# Patient Record
Sex: Female | Born: 1965 | Race: White | Hispanic: No | Marital: Married | Smoking: Never smoker
Health system: Southern US, Community
[De-identification: ages and names within clinical notes are randomized; demographics above are authoritative.]

## PROBLEM LIST (undated history)

## (undated) DIAGNOSIS — K279 Peptic ulcer, site unspecified, unspecified as acute or chronic, without hemorrhage or perforation: Secondary | ICD-10-CM

## (undated) DIAGNOSIS — K625 Hemorrhage of anus and rectum: Secondary | ICD-10-CM

## (undated) DIAGNOSIS — K219 Gastro-esophageal reflux disease without esophagitis: Secondary | ICD-10-CM

## (undated) HISTORY — PX: CHOLECYSTECTOMY: SHX55

## (undated) HISTORY — DX: Peptic ulcer, site unspecified, unspecified as acute or chronic, without hemorrhage or perforation: K27.9

## (undated) HISTORY — PX: OOPHORECTOMY: SHX86

## (undated) HISTORY — PX: ABDOMINAL HYSTERECTOMY: SHX81

## (undated) HISTORY — DX: Hemorrhage of anus and rectum: K62.5

## (undated) HISTORY — DX: Gastro-esophageal reflux disease without esophagitis: K21.9

## (undated) HISTORY — PX: SHOULDER SURGERY: SHX246

## (undated) HISTORY — PX: OTHER SURGICAL HISTORY: SHX169

---

## 1999-07-19 ENCOUNTER — Other Ambulatory Visit: Admission: RE | Admit: 1999-07-19 | Discharge: 1999-07-19 | Payer: Self-pay | Admitting: *Deleted

## 1999-09-10 ENCOUNTER — Emergency Department (HOSPITAL_COMMUNITY): Admission: EM | Admit: 1999-09-10 | Discharge: 1999-09-10 | Payer: Self-pay

## 2000-08-13 ENCOUNTER — Encounter: Payer: Self-pay | Admitting: Internal Medicine

## 2000-08-13 ENCOUNTER — Encounter: Admission: RE | Admit: 2000-08-13 | Discharge: 2000-08-13 | Payer: Self-pay | Admitting: Internal Medicine

## 2002-09-24 HISTORY — PX: COLONOSCOPY: SHX174

## 2003-03-08 ENCOUNTER — Ambulatory Visit (HOSPITAL_COMMUNITY): Admission: RE | Admit: 2003-03-08 | Discharge: 2003-03-08 | Payer: Self-pay | Admitting: Gastroenterology

## 2003-03-25 ENCOUNTER — Other Ambulatory Visit: Admission: RE | Admit: 2003-03-25 | Discharge: 2003-03-25 | Payer: Self-pay | Admitting: Obstetrics and Gynecology

## 2006-10-10 ENCOUNTER — Emergency Department (HOSPITAL_COMMUNITY): Admission: EM | Admit: 2006-10-10 | Discharge: 2006-10-10 | Payer: Self-pay | Admitting: Emergency Medicine

## 2006-10-17 ENCOUNTER — Emergency Department (HOSPITAL_COMMUNITY): Admission: EM | Admit: 2006-10-17 | Discharge: 2006-10-17 | Payer: Self-pay | Admitting: Emergency Medicine

## 2007-04-22 ENCOUNTER — Other Ambulatory Visit: Admission: RE | Admit: 2007-04-22 | Discharge: 2007-04-22 | Payer: Self-pay | Admitting: *Deleted

## 2008-04-21 ENCOUNTER — Other Ambulatory Visit: Admission: RE | Admit: 2008-04-21 | Discharge: 2008-04-21 | Payer: Self-pay | Admitting: Gynecology

## 2009-07-26 ENCOUNTER — Ambulatory Visit (HOSPITAL_COMMUNITY): Admission: RE | Admit: 2009-07-26 | Discharge: 2009-07-27 | Payer: Self-pay | Admitting: Obstetrics and Gynecology

## 2009-07-26 ENCOUNTER — Encounter (INDEPENDENT_AMBULATORY_CARE_PROVIDER_SITE_OTHER): Payer: Self-pay | Admitting: Obstetrics and Gynecology

## 2010-01-20 ENCOUNTER — Ambulatory Visit (HOSPITAL_COMMUNITY): Admission: RE | Admit: 2010-01-20 | Discharge: 2010-01-20 | Payer: Self-pay | Admitting: Family Medicine

## 2010-12-27 LAB — CBC
HCT: 30.1 % — ABNORMAL LOW (ref 36.0–46.0)
HCT: 38.5 % (ref 36.0–46.0)
Hemoglobin: 10.3 g/dL — ABNORMAL LOW (ref 12.0–15.0)
Hemoglobin: 13.1 g/dL (ref 12.0–15.0)
MCHC: 34 g/dL (ref 30.0–36.0)
MCHC: 34.3 g/dL (ref 30.0–36.0)
MCV: 88.3 fL (ref 78.0–100.0)
MCV: 89 fL (ref 78.0–100.0)
Platelets: 227 10*3/uL (ref 150–400)
Platelets: 310 10*3/uL (ref 150–400)
RBC: 3.38 MIL/uL — ABNORMAL LOW (ref 3.87–5.11)
RBC: 4.36 MIL/uL (ref 3.87–5.11)
RDW: 13.4 % (ref 11.5–15.5)
RDW: 13.7 % (ref 11.5–15.5)
WBC: 13.4 10*3/uL — ABNORMAL HIGH (ref 4.0–10.5)
WBC: 9.8 10*3/uL (ref 4.0–10.5)

## 2010-12-27 LAB — HCG, SERUM, QUALITATIVE: Preg, Serum: NEGATIVE

## 2011-01-04 ENCOUNTER — Ambulatory Visit: Payer: Self-pay | Admitting: Gastroenterology

## 2011-02-09 NOTE — Op Note (Signed)
NAME:  Julia Frederick, Julia Frederick                         ACCOUNT NO.:  [id number]   MEDICAL RECORD NO.:  [id number]                   PATIENT TYPE:  AMB   LOCATION:  ENDO                                 FACILITY:  [facility]   PHYSICIAN:  Anselmo Rod, M.D.               DATE OF BIRTH:  [birth year]   DATE OF PROCEDURE:  03/08/2003  DATE OF DISCHARGE:                                 OPERATIVE REPORT   PROCEDURE PERFORMED:  Colonoscopy.   ENDOSCOPIST:  Charna Elizabeth, M.D.   INSTRUMENT USED:  Olympus video colonoscope.   INDICATIONS FOR PROCEDURE:  The patient is a 45 year old white female with  history of rectal bleeding.  Rule out colonic polyps, masses, etc.   PREPROCEDURE PREPARATION:  Informed consent was procured from the patient.  The patient was fasted for eight hours prior to the procedure and prepped  with a bottle of magnesium citrate and a gallon of GoLYTELY the night prior  to the procedure.   PREPROCEDURE PHYSICAL:  The patient had stable vital signs.  Neck supple.  Chest clear to auscultation.  S1 and S2 regular.  Abdomen soft with normal  bowel sounds.   DESCRIPTION OF PROCEDURE:  The patient was placed in left lateral decubitus  position and sedated with 60 mg of Demerol and 6 mg of Versed intravenously.  Once the patient was adequately sedated and maintained on low flow oxygen  and continuous cardiac monitoring, the Olympus video colonoscope was  advanced from the rectum to the cecum and terminal ileum.  There was a large  amount of residual stool in the colon.  Multiple washes were done, small  lesions could have been missed.  No masses, polyps, erosions, ulcerations or  diverticulosis was noted.  The appendicular orifice and ileocecal valve were  clearly visualized and photographed.  The terminal ileum appeared normal and  without lesions.  Retroflexion in the rectum revealed small internal  hemorrhoids but no other abnormalities were noted.  The patient tolerated  the  procedure well without complications. There seemed to be some extrinsic  compression of the rectum on examination of the rectum but no other  abnormalities were seen.   IMPRESSION:  1. Small nonbleeding internal hemorrhoids.  Otherwise normal colonoscopy of     the terminal ileum.  2. Large amount of residual stool in the colon.  Small lesions could have     been missed.  3. Extrinsic compression of the rectum.  Rule out uterine fibroids, etc.   RECOMMENDATIONS:  1. A high fiber diet with liberal fluid intake has been advocated.  2. Repeat colorectal cancer screening is recommended at age 54 unless the     patient develops any abnormal symptoms in the interim.  3. Pelvic ultrasound versus CT to evaluate extrinsic compression of the     rectum.  Anselmo Rod, M.D.    JNM/MEDQ  D:  03/08/2003  T:  03/08/2003  Job:  [id number]   cc:   Gabriel Earing, M.D.  [address]  [place]  [place] [address]  Fax: [phone]

## 2011-04-17 ENCOUNTER — Ambulatory Visit: Payer: Self-pay | Admitting: Gastroenterology

## 2011-09-03 ENCOUNTER — Encounter: Payer: Self-pay | Admitting: Gastroenterology

## 2011-09-05 ENCOUNTER — Ambulatory Visit (INDEPENDENT_AMBULATORY_CARE_PROVIDER_SITE_OTHER): Payer: Federal, State, Local not specified - PPO | Admitting: Gastroenterology

## 2011-09-05 ENCOUNTER — Encounter: Payer: Self-pay | Admitting: Gastroenterology

## 2011-09-05 DIAGNOSIS — R1013 Epigastric pain: Secondary | ICD-10-CM

## 2011-09-05 DIAGNOSIS — K3189 Other diseases of stomach and duodenum: Secondary | ICD-10-CM

## 2011-09-05 LAB — HEPATIC FUNCTION PANEL
AST: 18 U/L (ref 0–37)
Albumin: 4.3 g/dL (ref 3.5–5.2)
Alkaline Phosphatase: 80 U/L (ref 39–117)
Total Bilirubin: 0.3 mg/dL (ref 0.3–1.2)
Total Protein: 6.9 g/dL (ref 6.0–8.3)

## 2011-09-05 LAB — LIPASE: Lipase: 38 U/L (ref 0–75)

## 2011-09-05 MED ORDER — OMEPRAZOLE 20 MG PO CPDR: 20.0000 mg | DELAYED_RELEASE_CAPSULE | Freq: Every day | ORAL | Status: AC

## 2011-09-05 NOTE — Progress Notes (Signed)
Reminder in epic to follow up in 4 months °

## 2011-09-05 NOTE — Progress Notes (Signed)
  Subjective:    Patient ID: Julia Frederick, female    DOB: 07/12/1966, 45 y.o.   MRN: [id number]  PCP: MANN, PA-C  HPI Sx pain in upper abdomen associated with nausea 1x/week. If eats 30-45 mins later she's had to rush to the bathroom. Always had since GB came out. nO PROBLEMS SWALLOWING. No known triggers-can be in middle of day or in the night. Began in the middle of the night & though it was reflux. Never tried antacids. Better: Ultram. Lasts for for minutes. No vomiting, or change in bowel habits. No weight loss. Gained 10 lbs in past 5 years. No tobaccor or EtOH, No ASA or Godddy's but uses Ibuprofen, Motrin, Aleve: 1x/month. Had HSY. GB out: stones. No workup for her pain. No dysuri or hematuria, BRBPR, or melena. Appetite: fine. Heartburn al least once a day.   No past medical history on file.  Past Surgical History  Procedure Date  . Cesarean section   . Cholecystectomy   . S/p hysterectomy    No Known Allergies  Current Outpatient Prescriptions  Medication Sig Dispense Refill  . traMADol (ULTRAM) 50 MG tablet Take 50 mg by mouth every 6 (six) hours as needed. Maximum dose= 8 tablets per day       . omeprazole (PRILOSEC) 20 MG capsule Take 1 capsule (20 mg total) by mouth daily.  30 capsule  11    Family History  Problem Relation Age of Onset  . Hypertension    . Cancer      History   Social History  . Marital Status: Married    Spouse Name: N/A    Number of Children: N/A  . Years of Education: N/A   Occupational History  . Not on file.   Social History Main Topics  . Smoking status: Never Smoker   . Smokeless tobacco: Not on file  . Alcohol Use: No  . Drug Use: No  . Sexually Active: Not on file   Other Topics Concern  . Not on file   Social History Narrative  . No narrative on file         Review of Systems  All other systems reviewed and are negative.       Objective:   Physical Exam  Vitals reviewed. Constitutional: She is oriented to  person, place, and time. She appears well-developed and well-nourished. No distress.  HENT:  Head: Normocephalic and atraumatic.  Mouth/Throat: Oropharynx is clear and moist. No oropharyngeal exudate.  Eyes: Pupils are equal, round, and reactive to light. No scleral icterus.  Neck: Normal range of motion. Neck supple.  Cardiovascular: Normal rate, regular rhythm and normal heart sounds.   Pulmonary/Chest: Effort normal and breath sounds normal. No respiratory distress.  Abdominal: Soft. Bowel sounds are normal. She exhibits no distension. There is no tenderness.  Musculoskeletal: Normal range of motion. She exhibits no edema.  Lymphadenopathy:    She has no cervical adenopathy.  Neurological: She is alert and oriented to person, place, and time.       NO FOCAL DEFICITS   Psychiatric: She has a normal mood and affect.          Assessment & Plan:   

## 2011-09-05 NOTE — Patient Instructions (Signed)
TAKE PRILOSEC EVERY MORNING. SUBMIT BLOOD FOR LABS. GET ABDOMINAL ULTRASOUND. UPPER ENDOSCOPY ON 12/14 OR 12/18 FOLLOW UP IN 4 MOS.

## 2011-09-06 ENCOUNTER — Other Ambulatory Visit: Payer: Self-pay | Admitting: Gastroenterology

## 2011-09-06 ENCOUNTER — Encounter: Payer: Self-pay | Admitting: Gastroenterology

## 2011-09-06 DIAGNOSIS — R1013 Epigastric pain: Secondary | ICD-10-CM

## 2011-09-06 NOTE — Assessment & Plan Note (Signed)
New onset most likely 2o to gastritis v. GERD.  TAKE PRILOSEC EVERY MORNING. SUBMIT BLOOD FOR LABS. GET ABDOMINAL ULTRASOUND. UPPER ENDOSCOPY ON 12/14 OR 12/18 FOLLOW UP IN 4 MOS.

## 2011-09-06 NOTE — Progress Notes (Signed)
Cc to PCP 

## 2011-09-06 NOTE — Assessment & Plan Note (Signed)
2O TO GERD AD/OR PUD OR GASTRITIS, less likely pancreatic ca or cbd stones.  TAKE PRILOSEC EVERY MORNING. SUBMIT BLOOD FOR hfp/lipase. GET ABDOMINAL ULTRASOUND. UPPER ENDOSCOPY ON 12/14 OR 12/18 FOLLOW UP IN 4 MOS.

## 2011-09-06 NOTE — Progress Notes (Signed)
Quick Note:  Informed pt. She wants to know when her [place] will be scheduled. Routing to [employer]. ______

## 2011-09-07 ENCOUNTER — Encounter (HOSPITAL_COMMUNITY)
Admission: RE | Disposition: A | Discharge status: Home / Self Care | Payer: Self-pay | Source: Ambulatory Visit | Attending: Gastroenterology

## 2011-09-07 ENCOUNTER — Ambulatory Visit (HOSPITAL_COMMUNITY)
Admission: RE | Admit: 2011-09-07 | Discharge: 2011-09-07 | Disposition: A | Discharge status: Home / Self Care | Payer: Federal, State, Local not specified - PPO | Source: Ambulatory Visit | Attending: Gastroenterology | Admitting: Gastroenterology

## 2011-09-07 ENCOUNTER — Encounter (HOSPITAL_COMMUNITY): Payer: Self-pay | Admitting: *Deleted

## 2011-09-07 ENCOUNTER — Other Ambulatory Visit: Payer: Self-pay | Admitting: Gastroenterology

## 2011-09-07 DIAGNOSIS — R109 Unspecified abdominal pain: Secondary | ICD-10-CM

## 2011-09-07 DIAGNOSIS — K2970 Gastritis, unspecified, without bleeding: Secondary | ICD-10-CM

## 2011-09-07 DIAGNOSIS — K2990 Gastroduodenitis, unspecified, without bleeding: Secondary | ICD-10-CM

## 2011-09-07 DIAGNOSIS — K2940 Chronic atrophic gastritis without bleeding: Secondary | ICD-10-CM | POA: Insufficient documentation

## 2011-09-07 DIAGNOSIS — R1013 Epigastric pain: Secondary | ICD-10-CM

## 2011-09-07 DIAGNOSIS — A048 Other specified bacterial intestinal infections: Secondary | ICD-10-CM | POA: Insufficient documentation

## 2011-09-07 DIAGNOSIS — K2980 Duodenitis without bleeding: Secondary | ICD-10-CM | POA: Insufficient documentation

## 2011-09-07 HISTORY — PX: ESOPHAGOGASTRODUODENOSCOPY: SHX5428

## 2011-09-07 SURGERY — EGD (ESOPHAGOGASTRODUODENOSCOPY)
Anesthesia: Moderate Sedation

## 2011-09-07 MED ORDER — MIDAZOLAM HCL 5 MG/5ML IJ SOLN
INTRAMUSCULAR | Status: AC
  Filled 2011-09-07: qty 5

## 2011-09-07 MED ORDER — STERILE WATER FOR IRRIGATION IR SOLN
Status: DC | PRN
  Administered 2011-09-07: 14:00:00

## 2011-09-07 MED ORDER — MEPERIDINE HCL 100 MG/ML IJ SOLN
INTRAMUSCULAR | Status: DC | PRN
  Administered 2011-09-07: 25 mg via INTRAVENOUS
  Administered 2011-09-07: 50 mg via INTRAVENOUS
  Administered 2011-09-07: 25 mg via INTRAVENOUS

## 2011-09-07 MED ORDER — SODIUM CHLORIDE 0.45 % IV SOLN
Freq: Once | INTRAVENOUS | Status: AC
  Administered 2011-09-07: 1000 mL via INTRAVENOUS

## 2011-09-07 MED ORDER — BUTAMBEN-TETRACAINE-BENZOCAINE 2-2-14 % EX AERO
INHALATION_SPRAY | CUTANEOUS | Status: DC | PRN
  Administered 2011-09-07: 2 via TOPICAL

## 2011-09-07 MED ORDER — MEPERIDINE HCL 100 MG/ML IJ SOLN
INTRAMUSCULAR | Status: AC
  Filled 2011-09-07: qty 1

## 2011-09-07 MED ORDER — MIDAZOLAM HCL 5 MG/5ML IJ SOLN
INTRAMUSCULAR | Status: DC | PRN
  Administered 2011-09-07 (×2): 1 mg via INTRAVENOUS
  Administered 2011-09-07 (×2): 2 mg via INTRAVENOUS

## 2011-09-07 NOTE — Interval H&P Note (Signed)
History and Physical Interval Note:  09/07/2011 1:46 PM  Julia Frederick  has presented today for surgery, with the diagnosis of dyspepsia and epigastric pain  The various methods of treatment have been discussed with the patient and family. After consideration of risks, benefits and other options for treatment, the patient has consented to  Procedure(s): ESOPHAGOGASTRODUODENOSCOPY (EGD) as a surgical intervention .  The patients' history has been reviewed, patient examined, no change in status, stable for surgery.  I have reviewed the patients' chart and labs.  Questions were answered to the patient's satisfaction.     [employer]

## 2011-09-07 NOTE — H&P (View-Only) (Signed)
  Subjective:    Patient ID: Julia Frederick, female    DOB: 1965/10/16, 45 y.o.   MRN: [id number]  PCP: Loreta Ave, PA-C  HPI Sx pain in upper abdomen associated with nausea 1x/week. If eats 30-45 mins later she's had to rush to the bathroom. Always had since GB came out. nO PROBLEMS SWALLOWING. No known triggers-can be in middle of day or in the night. Began in the middle of the night & though it was reflux. Never tried antacids. Better: Ultram. Lasts for for minutes. No vomiting, or change in bowel habits. No weight loss. Gained 10 lbs in past 5 years. No tobaccor or EtOH, No ASA or Godddy's but uses Ibuprofen, Motrin, Aleve: 1x/month. Had HSY. GB out: stones. No workup for her pain. No dysuri or hematuria, BRBPR, or melena. Appetite: fine. Heartburn al least once a day.   No past medical history on file.  Past Surgical History  Procedure Date  . Cesarean section   . Cholecystectomy   . S/p hysterectomy    No Known Allergies  Current Outpatient Prescriptions  Medication Sig Dispense Refill  . traMADol (ULTRAM) 50 MG tablet Take 50 mg by mouth every 6 (six) hours as needed. Maximum dose= 8 tablets per day       . omeprazole (PRILOSEC) 20 MG capsule Take 1 capsule (20 mg total) by mouth daily.  30 capsule  11    Family History  Problem Relation Age of Onset  . Hypertension    . Cancer      History   Social History  . Marital Status: Married    Spouse Name: N/A    Number of Children: N/A  . Years of Education: N/A   Occupational History  . Not on file.   Social History Main Topics  . Smoking status: Never Smoker   . Smokeless tobacco: Not on file  . Alcohol Use: No  . Drug Use: No  . Sexually Active: Not on file   Other Topics Concern  . Not on file   Social History Narrative  . No narrative on file         Review of Systems  All other systems reviewed and are negative.       Objective:   Physical Exam  Vitals reviewed. Constitutional: She is oriented to  person, place, and time. She appears well-developed and well-nourished. No distress.  HENT:  Head: Normocephalic and atraumatic.  Mouth/Throat: Oropharynx is clear and moist. No oropharyngeal exudate.  Eyes: Pupils are equal, round, and reactive to light. No scleral icterus.  Neck: Normal range of motion. Neck supple.  Cardiovascular: Normal rate, regular rhythm and normal heart sounds.   Pulmonary/Chest: Effort normal and breath sounds normal. No respiratory distress.  Abdominal: Soft. Bowel sounds are normal. She exhibits no distension. There is no tenderness.  Musculoskeletal: Normal range of motion. She exhibits no edema.  Lymphadenopathy:    She has no cervical adenopathy.  Neurological: She is alert and oriented to person, place, and time.       NO FOCAL DEFICITS   Psychiatric: She has a normal mood and affect.          Assessment & Plan:

## 2011-09-07 NOTE — Op Note (Signed)
[facility] [address] [place], [place]  [address]  ENDOSCOPY PROCEDURE REPORT  PATIENT:  Julia Frederick, Julia Frederick  MR#:  [id number] BIRTHDATE:  1966/02/21, 45 yrs. old  GENDER:  female  ENDOSCOPIST:  Jonette Eva, MD Referred by:  PROCEDURE DATE:  09/07/2011 PROCEDURE:  EGD with biopsy, 43239 ASA CLASS: INDICATIONS:  ABDOMINAL PAIN USES MOTRIN OR ALEVE  MEDICATIONS:   Demerol 100 mg IV, Versed 6 mg IV TOPICAL ANESTHETIC:  Cetacaine Spray  DESCRIPTION OF PROCEDURE:     Physical exam was performed. Informed consent was obtained from the patient after explaining the benefits, risks, and alternatives to the procedure.  The patient was connected to the monitor and placed in the left lateral position.  Continuous oxygen was provided by nasal cannula and IV medicine administered through an indwelling cannula.  After administration of sedation, the patient's esophagus was intubated and the EG-[id number] ([id number]) endoscope was advanced under direct visualization to the second portion of the duodenum.  The scope was removed slowly by carefully examining the color, texture, anatomy, and integrity of the mucosa on the way out.  The patient was recovered in endoscopy and discharged home in satisfactory condition. <<PROCEDUREIMAGES>>  Moderate gastritis was found 7 BIOPSIED VIA COLD FORCEPS.  SEVERE Duodenitis(EROSIONS/ERYTHEMA) was found IN THE DUODENAL BULB. NL 2ND PORTION OF THE DUODENUM.  COMPLICATIONS:    None  ENDOSCOPIC IMPRESSION: 1) Moderate gastritis 2) Duodenitis, SEVERE  RECOMMENDATIONS: AVOID NSAIDS/ASA FOR 2 WEEKS OMP DAILY AWAIT BIOPSIES OPV APR 2013  REPEAT EXAM:  No  ______________________________ Jonette Eva, MD  CC:  n. eSIGNED:   Margaux Engen at 09/07/2011 02:20 PM  Glean Salvo, [phone]

## 2011-09-11 ENCOUNTER — Ambulatory Visit (HOSPITAL_COMMUNITY): Payer: Federal, State, Local not specified - PPO

## 2011-09-12 ENCOUNTER — Telehealth: Payer: Self-pay | Admitting: Gastroenterology

## 2011-09-12 NOTE — Telephone Encounter (Signed)
pLEASE CALL PT. Her stomach Bx showed H. Pylori infection. She needs AMOXICILLIN 500 mg 2 po BID for 10 days and Biaxin 500 mg po bid for 10 days, #qs, rfx0. She needs Omeprazole 20 mg BID for 10 days then 1 po dAILY. Med side effects include NVD, abd pain, and metallic taste.

## 2011-09-13 NOTE — Telephone Encounter (Signed)
LMOM to call.

## 2011-09-13 NOTE — Telephone Encounter (Signed)
Pt returned call and was informed. Rx's called to [facility] @ [address] on [address].

## 2011-09-24 ENCOUNTER — Encounter (HOSPITAL_COMMUNITY): Payer: Self-pay | Admitting: Gastroenterology

## 2011-12-25 ENCOUNTER — Encounter: Payer: Self-pay | Admitting: Gastroenterology

## 2012-06-24 ENCOUNTER — Other Ambulatory Visit (HOSPITAL_COMMUNITY): Payer: Self-pay | Admitting: Physician Assistant

## 2012-06-24 DIAGNOSIS — Z0000 Encounter for general adult medical examination without abnormal findings: Secondary | ICD-10-CM

## 2012-07-03 ENCOUNTER — Ambulatory Visit (HOSPITAL_COMMUNITY): Payer: Federal, State, Local not specified - PPO

## 2012-07-03 ENCOUNTER — Ambulatory Visit (HOSPITAL_COMMUNITY)
Admission: RE | Admit: 2012-07-03 | Discharge: 2012-07-03 | Disposition: A | Discharge status: Home / Self Care | Payer: Federal, State, Local not specified - PPO | Source: Ambulatory Visit | Attending: Physician Assistant | Admitting: Physician Assistant

## 2012-07-03 DIAGNOSIS — Z0000 Encounter for general adult medical examination without abnormal findings: Secondary | ICD-10-CM

## 2012-07-03 DIAGNOSIS — R922 Inconclusive mammogram: Secondary | ICD-10-CM | POA: Insufficient documentation

## 2012-07-09 ENCOUNTER — Other Ambulatory Visit: Payer: Self-pay | Admitting: Physician Assistant

## 2012-07-09 DIAGNOSIS — R928 Other abnormal and inconclusive findings on diagnostic imaging of breast: Secondary | ICD-10-CM

## 2012-07-10 ENCOUNTER — Other Ambulatory Visit: Payer: Self-pay | Admitting: Physician Assistant

## 2012-07-10 DIAGNOSIS — R928 Other abnormal and inconclusive findings on diagnostic imaging of breast: Secondary | ICD-10-CM

## 2012-07-15 ENCOUNTER — Ambulatory Visit
Admission: RE | Admit: 2012-07-15 | Discharge: 2012-07-15 | Disposition: A | Discharge status: Home / Self Care | Payer: Federal, State, Local not specified - PPO | Source: Ambulatory Visit | Attending: Physician Assistant | Admitting: Physician Assistant

## 2012-07-15 DIAGNOSIS — R928 Other abnormal and inconclusive findings on diagnostic imaging of breast: Secondary | ICD-10-CM

## 2013-06-02 ENCOUNTER — Emergency Department: Payer: Self-pay | Admitting: Emergency Medicine

## 2013-06-17 ENCOUNTER — Ambulatory Visit: Payer: Self-pay | Admitting: Specialist

## 2014-07-19 ENCOUNTER — Other Ambulatory Visit: Payer: Self-pay

## 2014-11-22 ENCOUNTER — Ambulatory Visit: Payer: Self-pay | Admitting: Family Medicine

## 2014-11-24 ENCOUNTER — Ambulatory Visit: Payer: Self-pay | Admitting: Family Medicine

## 2015-11-02 ENCOUNTER — Ambulatory Visit (INDEPENDENT_AMBULATORY_CARE_PROVIDER_SITE_OTHER): Payer: Federal, State, Local not specified - PPO

## 2015-11-02 ENCOUNTER — Encounter: Payer: Self-pay | Admitting: Podiatry

## 2015-11-02 ENCOUNTER — Ambulatory Visit (INDEPENDENT_AMBULATORY_CARE_PROVIDER_SITE_OTHER): Payer: Federal, State, Local not specified - PPO | Admitting: Podiatry

## 2015-11-02 VITALS — BP 133/91 | HR 113 | Resp 18

## 2015-11-02 DIAGNOSIS — R52 Pain, unspecified: Secondary | ICD-10-CM

## 2015-11-02 DIAGNOSIS — M722 Plantar fascial fibromatosis: Secondary | ICD-10-CM

## 2015-11-02 MED ORDER — METHYLPREDNISOLONE 4 MG PO TBPK: ORAL_TABLET | ORAL | Status: AC

## 2015-11-02 MED ORDER — MELOXICAM 15 MG PO TABS: 15.0000 mg | ORAL_TABLET | Freq: Every day | ORAL | Status: DC

## 2015-11-02 NOTE — Patient Instructions (Signed)

## 2015-11-02 NOTE — Progress Notes (Signed)
   Subjective:    Patient ID: Julia Frederick, female    DOB: December 02, 1965, 50 y.o.   MRN: [id number]  HPI i have some right foot pain and it travels to the arch area and has been going on since christmas and walks a lot and is sore and tender and hurts am and after i get up    Review of Systems  All other systems reviewed and are negative.      Objective:   Physical Exam: I have reviewed her past medical history medications allergies surgery social history and review of systems. Vital signs are stable alert and oriented 3. No acute distress. Pulses are strongly palpable bilateral. Neurologic sensorium is intact per Semmes-Weinstein monofilament. Deep tendon reflexes are intact bilateral and muscle strength +5 over 5 dorsiflexion plantar flexors and inverters everters all intrinsic musculature is intact. Orthopedic evaluation to Mr. Insall joints distal to the ankle for range of motion without crepitation. She has moderate to severe pain on palpation of the medial calcaneal tubercle of the right heel. No pain on medial and lateral compression of the calcaneus. Cutaneous evaluation demonstrates supple well-hydrated cutis no erythema edema cellulitis drainage or odor. Radiographs today do demonstrate a small plantar distally oriented calcaneal heel spur with a soft tissue increase in density at the plantar fascial calcaneal insertion site.        Assessment & Plan:  Assessment: Plantar fasciitis right foot.  Plan: We discussed the etiology pathology conservative versus surgical therapies. We discussed appropriate shoe gear stretching a size ice therapy and sugar modifications. I injected the right heel today with Kenalog and local anesthetic. Placed her in a plantar fascial brace as well as a night splint. Her prescription for a Medrol Dosepak to be followed by meloxicam and she was given a copy of written and diagrammatic stretching instructions. I will follow up with her in 1 month.

## 2015-11-30 ENCOUNTER — Ambulatory Visit: Payer: Federal, State, Local not specified - PPO | Admitting: Podiatry

## 2015-12-07 ENCOUNTER — Encounter (INDEPENDENT_AMBULATORY_CARE_PROVIDER_SITE_OTHER): Payer: Federal, State, Local not specified - PPO | Admitting: Podiatry

## 2015-12-07 NOTE — Progress Notes (Signed)
This encounter was created in error - please disregard.

## 2016-12-14 ENCOUNTER — Ambulatory Visit (INDEPENDENT_AMBULATORY_CARE_PROVIDER_SITE_OTHER): Payer: Federal, State, Local not specified - PPO

## 2016-12-14 ENCOUNTER — Encounter: Payer: Self-pay | Admitting: Podiatry

## 2016-12-14 ENCOUNTER — Ambulatory Visit (INDEPENDENT_AMBULATORY_CARE_PROVIDER_SITE_OTHER): Payer: Federal, State, Local not specified - PPO | Admitting: Podiatry

## 2016-12-14 DIAGNOSIS — R52 Pain, unspecified: Secondary | ICD-10-CM | POA: Diagnosis not present

## 2016-12-14 DIAGNOSIS — M722 Plantar fascial fibromatosis: Secondary | ICD-10-CM

## 2016-12-14 DIAGNOSIS — M205X1 Other deformities of toe(s) (acquired), right foot: Secondary | ICD-10-CM

## 2016-12-14 MED ORDER — MELOXICAM 15 MG PO TABS: 15.0000 mg | ORAL_TABLET | Freq: Every day | ORAL | 3 refills | Status: AC

## 2016-12-14 NOTE — Progress Notes (Signed)
This patient presents the office with chief complaint of severe pain noted in her right heel as the pain was very severe last night that she took this morning off from work. She says that she's had no history of trauma or reinjury to her foot. She says she had the same problem approximately one year ago with pain in the heel that travels through the arch of the right foot. She was seen by Dr. Al CorpusHyatt and treated with injection therapy and Mobic. She says she has been better until last night he presents the office today for an evaluation and treatment of this painful right foot  GENERAL APPEARANCE: Alert, conversant. Appropriately groomed. No acute distress.  VASCULAR: Pedal pulses are  palpable at  [facility] and PT bilateral.  Capillary refill time is immediate to all digits,  Normal temperature gradient.  Digital hair growth is present bilateral  NEUROLOGIC: sensation is normal to 5.07 monofilament at 5/5 sites bilateral.  Light touch is intact bilateral, Muscle strength normal.  MUSCULOSKELETAL: acceptable muscle strength, tone and stability bilateral.  Intrinsic muscluature intact bilateral.  Functional hallux limitus 1st MPJ  Right foot. Palpable pain at the inserion plantar fascia right foot.  DERMATOLOGIC: skin color, texture, and turgor are within normal limits.  No preulcerative lesions or ulcers  are seen, no interdigital maceration noted.  No open lesions present.  Digital nails are asymptomatic. No drainage noted.   Plantar fascitis with functional hallux limitus 1st MPJ  Right foot.  ROV  Xray reveal spurring right heel.  Recommended stretching and icing right foot.  Injection therapy .  Prescribed Mobic Powersteps prescribed.  RTC 10 day.   Helane GuntherGregory Melana Hingle DPM

## 2016-12-26 ENCOUNTER — Ambulatory Visit: Payer: Federal, State, Local not specified - PPO | Admitting: Podiatry

## 2017-12-24 ENCOUNTER — Other Ambulatory Visit: Payer: Self-pay | Admitting: Family Medicine

## 2017-12-24 DIAGNOSIS — Z1231 Encounter for screening mammogram for malignant neoplasm of breast: Secondary | ICD-10-CM

## 2017-12-30 ENCOUNTER — Ambulatory Visit
Admission: RE | Admit: 2017-12-30 | Discharge: 2017-12-30 | Disposition: A | Discharge status: Home / Self Care | Payer: Federal, State, Local not specified - PPO | Source: Ambulatory Visit | Attending: Family Medicine | Admitting: Family Medicine

## 2017-12-30 DIAGNOSIS — Z1231 Encounter for screening mammogram for malignant neoplasm of breast: Secondary | ICD-10-CM | POA: Insufficient documentation

## 2019-02-13 ENCOUNTER — Other Ambulatory Visit: Payer: Self-pay | Admitting: Family Medicine

## 2019-02-13 DIAGNOSIS — Z1231 Encounter for screening mammogram for malignant neoplasm of breast: Secondary | ICD-10-CM

## 2019-03-17 ENCOUNTER — Other Ambulatory Visit: Payer: Self-pay

## 2019-03-17 ENCOUNTER — Ambulatory Visit
Admission: RE | Admit: 2019-03-17 | Discharge: 2019-03-17 | Disposition: A | Discharge status: Home / Self Care | Payer: Federal, State, Local not specified - PPO | Source: Ambulatory Visit | Attending: Family Medicine | Admitting: Family Medicine

## 2019-03-17 DIAGNOSIS — Z1231 Encounter for screening mammogram for malignant neoplasm of breast: Secondary | ICD-10-CM

## 2020-08-11 ENCOUNTER — Other Ambulatory Visit: Payer: Self-pay | Admitting: Family Medicine

## 2020-08-11 DIAGNOSIS — Z1231 Encounter for screening mammogram for malignant neoplasm of breast: Secondary | ICD-10-CM

## 2020-08-17 ENCOUNTER — Other Ambulatory Visit: Payer: Self-pay

## 2020-08-17 ENCOUNTER — Ambulatory Visit
Admission: RE | Admit: 2020-08-17 | Discharge: 2020-08-17 | Disposition: A | Discharge status: Home / Self Care | Payer: Federal, State, Local not specified - PPO | Source: Ambulatory Visit | Attending: Family Medicine | Admitting: Family Medicine

## 2020-08-17 DIAGNOSIS — Z1231 Encounter for screening mammogram for malignant neoplasm of breast: Secondary | ICD-10-CM | POA: Insufficient documentation

## 2020-10-13 ENCOUNTER — Other Ambulatory Visit: Payer: Self-pay

## 2020-10-13 ENCOUNTER — Encounter: Payer: Self-pay | Admitting: Emergency Medicine

## 2020-10-13 ENCOUNTER — Emergency Department
Admission: EM | Admit: 2020-10-13 | Discharge: 2020-10-13 | Disposition: A | Discharge status: Home / Self Care | Payer: Federal, State, Local not specified - PPO | Attending: Emergency Medicine | Admitting: Emergency Medicine

## 2020-10-13 ENCOUNTER — Emergency Department: Payer: Federal, State, Local not specified - PPO

## 2020-10-13 DIAGNOSIS — N390 Urinary tract infection, site not specified: Secondary | ICD-10-CM | POA: Diagnosis not present

## 2020-10-13 DIAGNOSIS — R112 Nausea with vomiting, unspecified: Secondary | ICD-10-CM | POA: Diagnosis present

## 2020-10-13 LAB — COMPREHENSIVE METABOLIC PANEL
ALT: 16 U/L (ref 0–44)
AST: 15 U/L (ref 15–41)
Albumin: 4.4 g/dL (ref 3.5–5.0)
Alkaline Phosphatase: 74 U/L (ref 38–126)
Anion gap: 13 (ref 5–15)
BUN: 15 mg/dL (ref 6–20)
CO2: 24 mmol/L (ref 22–32)
Calcium: 9.4 mg/dL (ref 8.9–10.3)
Chloride: 100 mmol/L (ref 98–111)
Creatinine, Ser: 0.82 mg/dL (ref 0.44–1.00)
GFR, Estimated: >60 mL/min (ref >60)
Glucose, Bld: 151 mg/dL — ABNORMAL HIGH (ref 70–99)
Potassium: 3.5 mmol/L (ref 3.5–5.1)
Sodium: 137 mmol/L (ref 135–145)
Total Bilirubin: 0.9 mg/dL (ref 0.3–1.2)
Total Protein: 7.6 g/dL (ref 6.5–8.1)

## 2020-10-13 LAB — URINALYSIS, COMPLETE (UACMP) WITH MICROSCOPIC
Bacteria, UA: NONE SEEN
Bilirubin Urine: NEGATIVE
Glucose, UA: NEGATIVE mg/dL
Ketones, ur: 5 mg/dL — AB
Nitrite: NEGATIVE
Protein, ur: 30 mg/dL — AB
Specific Gravity, Urine: 1.035 — ABNORMAL HIGH (ref 1.005–1.030)
WBC, UA: >50 WBC/hpf — ABNORMAL HIGH (ref 0–5)
pH: 5 (ref 5.0–8.0)

## 2020-10-13 LAB — CBC
HCT: 43.2 % (ref 36.0–46.0)
Hemoglobin: 15.2 g/dL — ABNORMAL HIGH (ref 12.0–15.0)
MCH: 31 pg (ref 26.0–34.0)
MCHC: 35.2 g/dL (ref 30.0–36.0)
MCV: 88.2 fL (ref 80.0–100.0)
Platelets: 356 10*3/uL (ref 150–400)
RBC: 4.9 MIL/uL (ref 3.87–5.11)
RDW: 11.6 % (ref 11.5–15.5)
WBC: 11.8 10*3/uL — ABNORMAL HIGH (ref 4.0–10.5)
nRBC: 0 % (ref 0.0–0.2)

## 2020-10-13 LAB — LIPASE, BLOOD: Lipase: 29 U/L (ref 11–51)

## 2020-10-13 LAB — TROPONIN I (HIGH SENSITIVITY): Troponin I (High Sensitivity): 3 ng/L (ref <18)

## 2020-10-13 MED ORDER — SULFAMETHOXAZOLE-TRIMETHOPRIM 800-160 MG PO TABS: 1.0000 | ORAL_TABLET | Freq: Two times a day (BID) | ORAL | 0 refills | Status: DC

## 2020-10-13 MED ORDER — ONDANSETRON 4 MG PO TBDP
4.0000 mg | ORAL_TABLET | Freq: Once | ORAL | Status: AC
  Administered 2020-10-13: 4 mg via ORAL
  Filled 2020-10-13: qty 1

## 2020-10-13 MED ORDER — ONDANSETRON 4 MG PO TBDP: 4.0000 mg | ORAL_TABLET | Freq: Three times a day (TID) | ORAL | 0 refills | Status: AC | PRN

## 2020-10-13 NOTE — Discharge Instructions (Signed)
Follow-up with your primary care provider if any continued problems or concerns.  Increase fluids.  Take Zofran ODT every 8 hours if needed for nausea.  You may take Tylenol or ibuprofen if needed for pain, fever or body aches.  Begin taking antibiotic as directed until completely finished.  If any severe worsening of your symptoms such as persistent vomiting, fever, chills or back ache you will need to return to the emergency department.

## 2020-10-13 NOTE — ED Triage Notes (Signed)
Pt to ED from home c/o abd pain and cramping for a couple days and then nausea and vomiting x4 today.  States chills at home, fever, weakness as well.  Pt A&Ox4, chest rise even and unlabored, in NAD at this time.

## 2020-10-13 NOTE — ED Notes (Signed)
Pt c/o N/V with chills, fatigue , with abd cramping that started yesterday. Denies diarrhea. Denies any painful urination. States the nausea has improved since her arrival to the ED.Marland Kitchen

## 2020-10-13 NOTE — ED Provider Notes (Signed)
[facility] Emergency Department Provider Note  ____________________________________________   Event Date/Time   First MD Initiated Contact with Patient 10/13/20 [phone]     (approximate)  I have reviewed the triage vital signs and the nursing notes.   HISTORY  Chief Complaint Abdominal Pain, Nausea, and Vomiting   HPI Julia Frederick is a 55 y.o. female presents to the ED with complaint of nausea and vomiting x4 and chills.  Patient states that she had some lower abdominal cramping yesterday but denies any diarrhea.  Patient denies any urinary symptoms at this time.  Patient reports that she has had urinary tract infection in the past.  No respiratory complaints.  She reports her pain as 6 out of 10.       Past Medical History:  Diagnosis Date  . GERD (gastroesophageal reflux disease)   . PUD (peptic ulcer disease) 2001 UGI  . Rectal bleeding 2004 2o to hemorrhoids    Patient Active Problem List   Diagnosis Date Noted  . Dyspepsia 09/06/2011  . Epigastric pain 09/06/2011    Past Surgical History:  Procedure Laterality Date  . ABDOMINAL HYSTERECTOMY    . CESAREAN SECTION    . CHOLECYSTECTOMY    . COLONOSCOPY  2004  . ESOPHAGOGASTRODUODENOSCOPY  09/07/2011   Procedure: ESOPHAGOGASTRODUODENOSCOPY (EGD);  Surgeon: Arlyce Harman, MD;  Location: [facility];  Service: Endoscopy;  Laterality: N/A;  1:55  . OOPHORECTOMY    . S/P Hysterectomy      Prior to Admission medications   Medication Sig Start Date End Date Taking? Authorizing Provider  ondansetron (ZOFRAN ODT) 4 MG disintegrating tablet Take 1 tablet (4 mg total) by mouth every 8 (eight) hours as needed for nausea or vomiting. 10/13/20  Yes Bridget Hartshorn L, PA-C  sulfamethoxazole-trimethoprim (BACTRIM DS) 800-160 MG tablet Take 1 tablet by mouth 2 (two) times daily. 10/13/20  Yes Bridget Hartshorn L, PA-C  fluticasone (FLONASE) 50 MCG/ACT nasal spray Place into the nose. 02/15/15 02/15/16   [provider]  meloxicam (MOBIC) 15 MG tablet Take 1 tablet (15 mg total) by mouth daily. 12/14/16   Helane Gunther, DPM  methylPREDNISolone (MEDROL) 4 MG TBPK tablet Tapering 6 day dose pack 11/02/15   Hyatt, Max T, DPM  omeprazole (PRILOSEC) 20 MG capsule Take 1 capsule (20 mg total) by mouth daily. 09/05/11 09/04/12  West Bali, MD  traMADol (ULTRAM) 50 MG tablet Take 50 mg by mouth every 6 (six) hours as needed. Maximum dose= 8 tablets per day     [provider]    Allergies Patient has no known allergies.  Family History  Problem Relation Age of Onset  . Hypertension Other   . Cancer Other   . Breast cancer Paternal Grandmother     Social History Social History   Tobacco Use  . Smoking status: Never Smoker  . Smokeless tobacco: Never Used  Substance Use Topics  . Alcohol use: No  . Drug use: No    Review of Systems Constitutional: No fever/chills Eyes: No visual changes. ENT: No sore throat. Cardiovascular: Denies chest pain. Respiratory: Denies shortness of breath. Gastrointestinal: Positive for lower abdominal cramping.  Positive nausea, positive vomiting.  No diarrhea.  No constipation. Genitourinary: Negative for dysuria. Musculoskeletal: Negative for back pain. Skin: Negative for rash. Neurological: Negative for headaches, focal weakness or numbness.  ____________________________________________   PHYSICAL EXAM:  VITAL SIGNS: ED Triage Vitals  Enc Vitals Group     BP 10/13/20 0142 Marland Kitchen)  141/101     Pulse Rate 10/13/20 0142 94     Resp 10/13/20 0142 16     Temp 10/13/20 0142 99 F (37.2 C)     Temp Source 10/13/20 0142 Oral     SpO2 10/13/20 0142 96 %     Weight 10/13/20 0143 180 lb (81.6 kg)     Height 10/13/20 0143 5\' 3"  (1.6 m)     Head Circumference --      Peak Flow --      Pain Score 10/13/20 0143 6     Pain Loc --      Pain Edu? --      Excl. in GC? --     Constitutional: Alert and oriented. Well appearing and in no  acute distress. Eyes: Conjunctivae are normal. PERRL. EOMI. Head: Atraumatic. Cardiovascular: Normal rate, regular rhythm. Grossly normal heart sounds.  Good peripheral circulation. Respiratory: Normal respiratory effort.  No retractions. Lungs CTAB. Gastrointestinal: Soft and nontender. No distention.  No CVA tenderness.  Bowel sounds normoactive x4 quadrants. Musculoskeletal: Moves upper and lower extremities without any difficulty and patient is ambulatory without any assistance. Neurologic:  Normal speech and language. No gross focal neurologic deficits are appreciated. No gait instability. Skin:  Skin is warm, dry and intact. No rash noted. Psychiatric: Mood and affect are normal. Speech and behavior are normal.  ____________________________________________   LABS (all labs ordered are listed, but only abnormal results are displayed)  Labs Reviewed  COMPREHENSIVE METABOLIC PANEL - Abnormal; Notable for the following components:      Result Value   Glucose, Bld 151 (*)    All other components within normal limits  CBC - Abnormal; Notable for the following components:   WBC 11.8 (*)    Hemoglobin 15.2 (*)    All other components within normal limits  URINALYSIS, COMPLETE (UACMP) WITH MICROSCOPIC - Abnormal; Notable for the following components:   Color, Urine YELLOW (*)    APPearance CLOUDY (*)    Specific Gravity, Urine 1.035 (*)    Hgb urine dipstick SMALL (*)    Ketones, ur 5 (*)    Protein, ur 30 (*)    Leukocytes,Ua MODERATE (*)    WBC, UA >50 (*)    All other components within normal limits  URINE CULTURE  LIPASE, BLOOD  TROPONIN I (HIGH SENSITIVITY)    RADIOLOGY I, 10/15/20, personally viewed and evaluated these images (plain radiographs) as part of my medical decision making, as well as reviewing the written report by the radiologist.   Official radiology report(s): DG Chest 2 View  Result Date: 10/13/2020 CLINICAL DATA:  Cough, nausea and vomiting  with fever and weakness EXAM: CHEST - 2 VIEW COMPARISON:  None. FINDINGS: No consolidation, features of edema, pneumothorax, or effusion. Pulmonary vascularity is normally distributed. The cardiomediastinal contours are unremarkable. No acute osseous or soft tissue abnormality. Degenerative changes are present in the imaged spine and shoulders. Cholecystectomy clips in the right upper quadrant. IMPRESSION: No acute cardiopulmonary abnormality. Electronically Signed   By: 10/15/2020 M.D.   On: 10/13/2020 02:09   EKG Normal sinus rhythm with ventricular rate of 86. EKG reviewed by doctors in major ED. ____________________________________________   PROCEDURES  Procedure(s) performed (including Critical Care):  Procedures   ____________________________________________   INITIAL IMPRESSION / ASSESSMENT AND PLAN / ED COURSE  As part of my medical decision making, I reviewed the following data within the electronic MEDICAL RECORD NUMBER Notes from prior ED visits and  [place] Controlled Substance Database  55 year old female presents to the ED with complaint of nausea, vomiting, chills and fatigue.  Patient states she began having some low abdominal cramping for couple of days.  Patient denies any upper respiratory symptoms or diarrhea.  Urinalysis was very suggestive of a urinary tract infection.  Chest x-ray was negative.  All other lab work was within normal limits and troponin was 3.  Patient was discharged with prescription for Bactrim DS twice daily for 10 days and Zofran 4 mg ODT as needed for nausea.  Patient is encouraged to drink fluids.  She is also told to return to the emergency department if over the weekend she develops any worsening of her symptoms, continued vomiting or especially high fever, chills and back pain. ____________________________________________   FINAL CLINICAL IMPRESSION(S) / ED DIAGNOSES  Final diagnoses:  Acute urinary tract infection     ED Discharge Orders          Ordered    sulfamethoxazole-trimethoprim (BACTRIM DS) 800-160 MG tablet  2 times daily        10/13/20 0856    ondansetron (ZOFRAN ODT) 4 MG disintegrating tablet  Every 8 hours PRN        10/13/20 0856          *Please note:  Charolotte Eke was evaluated in Emergency Department on 10/13/2020 for the symptoms described in the history of present illness. She was evaluated in the context of the global COVID-19 pandemic, which necessitated consideration that the patient might be at risk for infection with the SARS-CoV-2 virus that causes COVID-19. Institutional protocols and algorithms that pertain to the evaluation of patients at risk for COVID-19 are in a state of rapid change based on information released by regulatory bodies including the CDC and federal and state organizations. These policies and algorithms were followed during the patient's care in the ED.  Some ED evaluations and interventions may be delayed as a result of limited staffing during and the pandemic.*   Note:  This document was prepared using Dragon voice recognition software and may include unintentional dictation errors.    Tommi Rumps, PA-C 10/13/20 1222    Delton Pra[place], MD 10/13/20 [phone]

## 2020-10-14 LAB — URINE CULTURE
Culture: 10000 — AB
Special Requests: NORMAL

## 2021-07-27 ENCOUNTER — Encounter: Payer: Self-pay | Admitting: Emergency Medicine

## 2021-07-27 ENCOUNTER — Ambulatory Visit
Admission: EM | Admit: 2021-07-27 | Discharge: 2021-07-27 | Disposition: A | Discharge status: Home / Self Care | Payer: Federal, State, Local not specified - PPO | Attending: Emergency Medicine | Admitting: Emergency Medicine

## 2021-07-27 DIAGNOSIS — N390 Urinary tract infection, site not specified: Secondary | ICD-10-CM | POA: Diagnosis not present

## 2021-07-27 LAB — POCT URINALYSIS DIP (MANUAL ENTRY)
Glucose, UA: NEGATIVE mg/dL
Nitrite, UA: NEGATIVE
Protein Ur, POC: 30 mg/dL — AB
Spec Grav, UA: >=1.03 — AB (ref 1.010–1.025)
Urobilinogen, UA: 1 E.U./dL
pH, UA: 6 (ref 5.0–8.0)

## 2021-07-27 MED ORDER — CEPHALEXIN 500 MG PO CAPS: 500.0000 mg | ORAL_CAPSULE | Freq: Two times a day (BID) | ORAL | 0 refills | Status: AC

## 2021-07-27 NOTE — ED Provider Notes (Signed)
Subjective:    Julia Frederick is a very pleasant 55 y.o. female who presents with concerns for UTI due to nausea and decreased urination over the last 2 days. No unilateral back pain, vomiting, fever, vaginal discharge, concern for STD.  Past medical history, past surgical history, current medications reviewed.  Allergies: has No Known Allergies.  Review of Systems See HPI   Objective:     Vitals:   07/27/21 1906  BP: 124/86  Pulse: 96  Temp: 99.5 F (37.5 C)  SpO2: 96%     General: Appears well-developed and well-nourished. No acute distress.  Cardiovascular: Normal rate Pulm/Chest: No respiratory distress Abdominal: No CVAT.  Neurological: Alert and oriented to person, place, and time.  Skin: Skin is warm and dry.  Psychiatric: Normal mood, affect, behavior, and thought content.  GU:  Deferred secondary to self-collect specimen  Laboratory:  Orders Placed This Encounter  Procedures   Urine Culture   POCT urinalysis dipstick   Results for orders placed or performed during the hospital encounter of 07/27/21  POCT urinalysis dipstick  Result Value Ref Range   Color, UA yellow yellow   Clarity, UA clear clear   Glucose, UA negative negative mg/dL   Bilirubin, UA small (A) negative   Ketones, POC UA trace (5) (A) negative mg/dL   Spec Grav, UA >=3.419 (A) 1.010 - 1.025   Blood, UA trace-lysed (A) negative   pH, UA 6.0 5.0 - 8.0   Protein Ur, POC =30 (A) negative mg/dL   Urobilinogen, UA 1.0 0.2 or 1.0 E.U./dL   Nitrite, UA Negative Negative   Leukocytes, UA Small (1+) (A) Negative     Assessment:   1. Acute UTI - Urine Culture; Standing - Urine Culture - cephALEXin (KEFLEX) 500 MG capsule; Take 1 capsule (500 mg total) by mouth 2 (two) times daily for 7 days.  Dispense: 14 capsule; Refill: 0   Plan:   MDM: Patient presents with concerns for UTI due to nausea and decreased urination over the last 2 days. No unilateral back pain, vomiting, fever, vaginal  discharge, concern for STD.  Urinalysis reveals small bilirubin, trace ketones, increased specific gravity, trace blood, 30 protein and small leukocytes.  Patient reports having had a UTI in January and states that this is a similar presentation for her.  On chart review, previous urine culture in January showed strep positive urine culture.  Urine culture today is pending.  Given symptoms and urinalysis, likely acute UTI.  Will cover with Keflex today.  Advised about home treatment and care to include increasing fluid intake, Uristat/Azo as needed for discomfort.  Advised to return to clinic or go to the ER with any fever, one-sided back pain or vomiting.  Patient verbalized understanding and agreed with plan.  Patient stable upon discharge.  Return as needed.    Discharge Instructions      You were seen today for an infection in the lower urinary tract. Your urine sample today was sent for a culture. The urine culture will show what type of bacteria grows and if you are on the appropriate antibiotic. If the antibiotic needs to be changed, you will receive a phone call from the follow up nurse who will give you more information. If you do not receive a call, then you are on the correct antibiotic.  Take antibiotics as directed. Finish course even if feeling better sooner. Drink plenty of clear fluids. Over the counter "Uristat" or "Azo Standard" may help your discomfort.  This will turn your urine dark orange or red and may stain contacts (remove before using). You may experience 24 - 48 hours of continuing discomfort until medication controls the infection.  Return to clinic or go to the ER if you develop a fever, one-sided back pain, or vomiting as these are signs of a worsening infection.          Amalia Greenhouse, FNP 07/27/21 1936

## 2021-07-27 NOTE — ED Triage Notes (Signed)
Pt c/o nausea and not urinating often x 2 days

## 2021-07-27 NOTE — Discharge Instructions (Addendum)

## 2021-07-29 LAB — URINE CULTURE: Culture: <10000 — AB

## 2021-12-22 ENCOUNTER — Other Ambulatory Visit: Payer: Self-pay | Admitting: Family Medicine

## 2021-12-22 DIAGNOSIS — Z1231 Encounter for screening mammogram for malignant neoplasm of breast: Secondary | ICD-10-CM

## 2022-01-31 ENCOUNTER — Ambulatory Visit
Admission: RE | Admit: 2022-01-31 | Discharge: 2022-01-31 | Disposition: A | Discharge status: Home / Self Care | Payer: Federal, State, Local not specified - PPO | Source: Ambulatory Visit | Attending: Family Medicine | Admitting: Family Medicine

## 2022-01-31 DIAGNOSIS — Z1231 Encounter for screening mammogram for malignant neoplasm of breast: Secondary | ICD-10-CM | POA: Diagnosis present

## 2022-07-18 ENCOUNTER — Other Ambulatory Visit: Payer: Self-pay

## 2022-07-18 ENCOUNTER — Telehealth: Payer: Self-pay | Admitting: Emergency Medicine

## 2022-07-18 ENCOUNTER — Emergency Department
Admission: EM | Admit: 2022-07-18 | Discharge: 2022-07-18 | Disposition: A | Discharge status: Home / Self Care | Attending: Emergency Medicine | Admitting: Emergency Medicine

## 2022-07-18 DIAGNOSIS — W540XXA Bitten by dog, initial encounter: Secondary | ICD-10-CM | POA: Diagnosis not present

## 2022-07-18 DIAGNOSIS — S80811A Abrasion, right lower leg, initial encounter: Secondary | ICD-10-CM | POA: Insufficient documentation

## 2022-07-18 DIAGNOSIS — Z23 Encounter for immunization: Secondary | ICD-10-CM | POA: Insufficient documentation

## 2022-07-18 DIAGNOSIS — Y990 Civilian activity done for income or pay: Secondary | ICD-10-CM | POA: Diagnosis not present

## 2022-07-18 DIAGNOSIS — S8991XA Unspecified injury of right lower leg, initial encounter: Secondary | ICD-10-CM | POA: Diagnosis present

## 2022-07-18 MED ORDER — AMOXICILLIN-POT CLAVULANATE 875-125 MG PO TABS: 1.0000 | ORAL_TABLET | Freq: Two times a day (BID) | ORAL | 0 refills | Status: AC

## 2022-07-18 MED ORDER — LIDOCAINE-EPINEPHRINE-TETRACAINE (LET) SOLUTION
3.0000 mL | Freq: Once | NASAL | Status: AC
  Administered 2022-07-18: 3 mL via TOPICAL
  Filled 2022-07-18: qty 3

## 2022-07-18 MED ORDER — TETANUS-DIPHTH-ACELL PERTUSSIS 5-2.5-18.5 LF-MCG/0.5 IM SUSY
0.5000 mL | PREFILLED_SYRINGE | Freq: Once | INTRAMUSCULAR | Status: AC
  Administered 2022-07-18: 0.5 mL via INTRAMUSCULAR
  Filled 2022-07-18: qty 0.5

## 2022-07-18 MED ORDER — AMOXICILLIN-POT CLAVULANATE 875-125 MG PO TABS
1.0000 | ORAL_TABLET | Freq: Once | ORAL | Status: AC
  Administered 2022-07-18: 1 via ORAL
  Filled 2022-07-18: qty 1

## 2022-07-18 MED ORDER — LIDOCAINE HCL (PF) 1 % IJ SOLN
5.0000 mL | Freq: Once | INTRAMUSCULAR | Status: DC
  Filled 2022-07-18: qty 5

## 2022-07-18 NOTE — Telephone Encounter (Signed)
Augmentin rx °

## 2022-07-18 NOTE — ED Provider Notes (Signed)
[facility] Emergency Department Provider Note     Event Date/Time   First MD Initiated Contact with Patient 07/18/22 1315     (approximate)   History   Animal Bite   HPI  Julia Frederick is a 56 y.o. female presents to the ED for evaluation of a work-related injury.  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  She presents to the ED with a posterior right calf wound with bleeding controlled.  No other injury reported at this time.  Patient reports an out-of-date tetanus status.  She reports a healthy, well attended [place] dog that bit her on the leg.  Animal control was notified by her employer.     Physical Exam   Triage Vital Signs: ED Triage Vitals [07/18/22 1223]  Enc Vitals Group     BP (!) 147/103     Pulse Rate 94     Resp 16     Temp 98.1 F (36.7 C)     Temp Source Oral     SpO2 97 %     Weight 170 lb (77.1 kg)     Height 5\' 2"  (1.575 m)     Head Circumference      Peak Flow      Pain Score 0     Pain Loc      Pain Edu?      Excl. in GC?     Most recent vital signs: Vitals:   07/18/22 1223  BP: (!) 147/103  Pulse: 94  Resp: 16  Temp: 98.1 F (36.7 C)  SpO2: 97%    General Awake, no distress. NAD CV:  Good peripheral perfusion.  RESP:  Normal effort.  ABD:  No distention.  MSK:  Posterior right calf with a small 2 cm deep skin abrasion with no active bleeding at this time.  Patient without any induration or warmth of the calf.   ED Results / Procedures / Treatments   Labs (all labs ordered are listed, but only abnormal results are displayed) Labs Reviewed - No data to display   EKG    RADIOLOGY   No results found.   PROCEDURES:  Critical Care performed: No  Procedures   MEDICATIONS ORDERED IN ED: Medications  Tdap (BOOSTRIX) injection 0.5 mL (0.5 mLs Intramuscular Given 07/18/22 1328)  amoxicillin-clavulanate (AUGMENTIN) 875-125 MG per tablet 1 tablet (1 tablet  Oral Given 07/18/22 1327)  lidocaine (PF) (XYLOCAINE) 1 % injection 5 mL (5 mLs Infiltration Given 07/18/22 1327)     IMPRESSION / MDM / ASSESSMENT AND PLAN / ED COURSE  I reviewed the triage vital signs and the nursing notes.                              Differential diagnosis includes, but is not limited to, dog bite, open wound, contusion, abrasion  Patient's presentation is most consistent with acute, uncomplicated illness.  Patient to the ED for evaluation of a work-related dog bite injury.  She presents with a calf wound denies any other injury at the time.  Tetanus is updated the patient started on an appropriate course of Augmentin.  She is discharged with wound care instructions and supplies.  Patient's diagnosis is consistent with dog bite. Patient is to follow up with animal control for rabies status confirmation and or requirement for quarantine as needed or otherwise directed. Patient is given ED precautions to return  to the ED for any worsening or new symptoms.     FINAL CLINICAL IMPRESSION(S) / ED DIAGNOSES   Final diagnoses:  Dog bite, initial encounter     Rx / DC Orders   ED Discharge Orders     None        Note:  This document was prepared using Dragon voice recognition software and may include unintentional dictation errors.    Melvenia Needles, PA-C 07/18/22 1507    Arta Silence, MD 07/24/22 [phone]

## 2022-07-18 NOTE — Discharge Instructions (Addendum)
Keep the wound clean, dry, and covered. Take the antibiotic as directed. Follow-up with your primary provider for interim wound care. Follow-up with the Stage manager for disposition of rabies vaccine status versus quarantine status of the dog.

## 2022-07-18 NOTE — ED Triage Notes (Signed)
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 Bleeding is controlled at this time.

## 2022-09-01 IMAGING — MG MM DIGITAL SCREENING BILAT W/ TOMO AND CAD
8 series · 8 of 24 positions shown · non-contrast
Comparison: Previous exam(s).

CLINICAL DATA: Screening.

EXAM:
DIGITAL SCREENING BILATERAL MAMMOGRAM WITH TOMOSYNTHESIS AND CAD
TECHNIQUE: Bilateral screening digital craniocaudal and mediolateral oblique
mammograms were obtained. Bilateral screening digital breast
tomosynthesis was performed. The images were evaluated with
computer-aided detection.

[R MLO synth-2D]
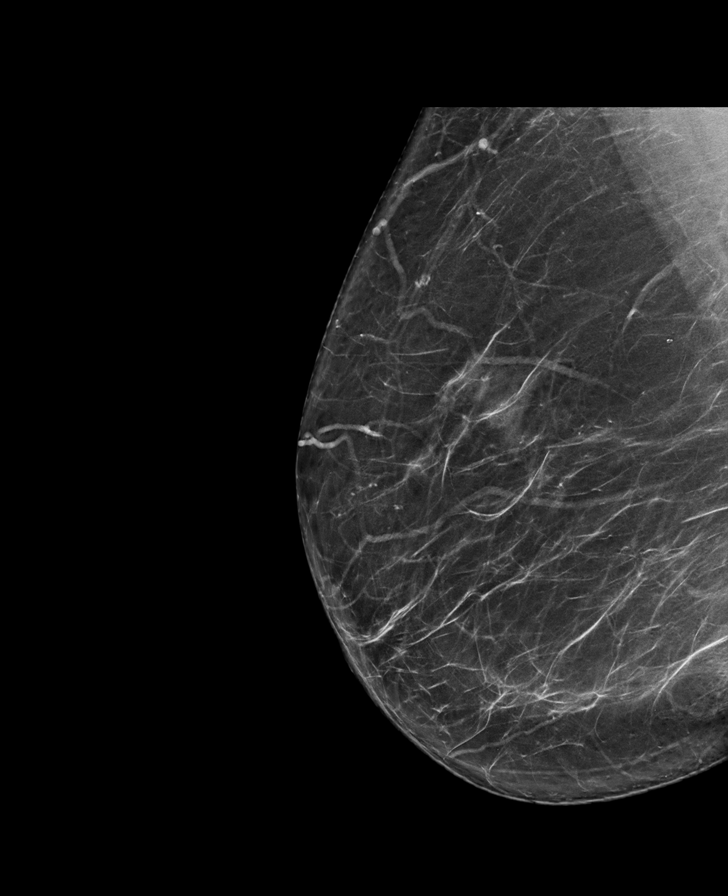

[L CC synth-2D]
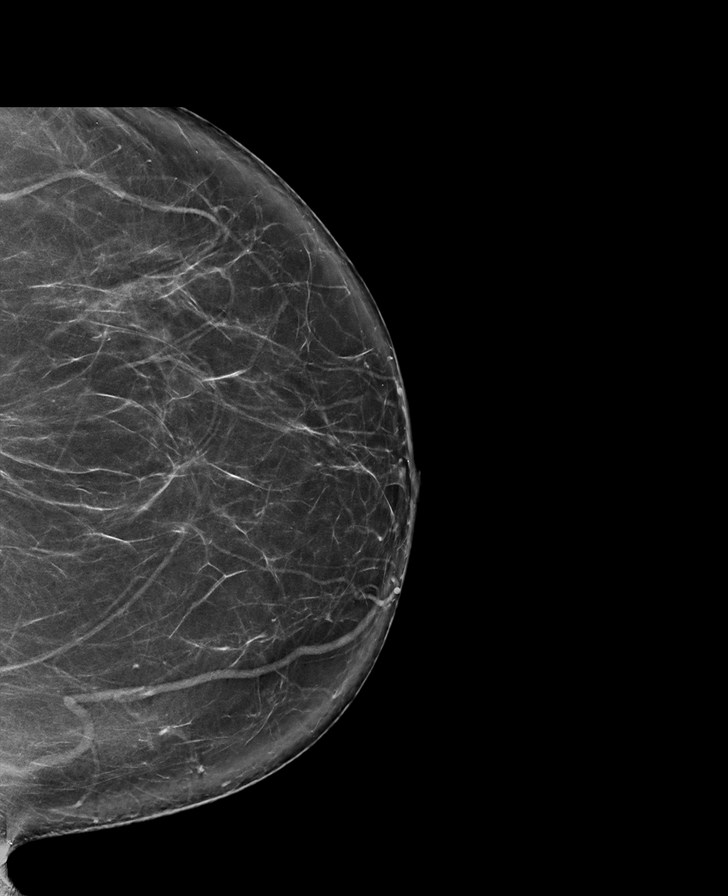

[R CC synth-2D]
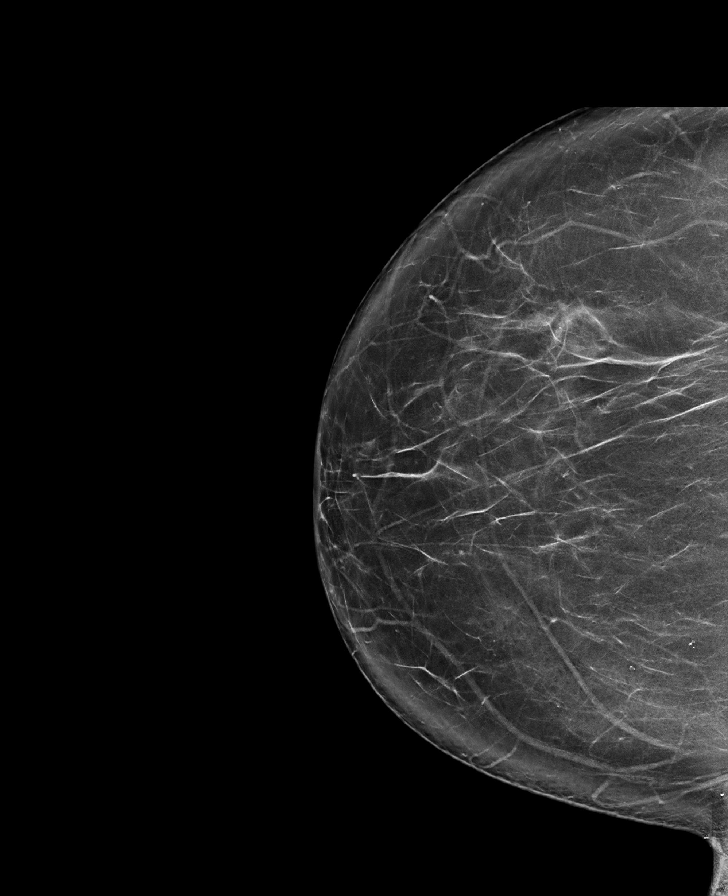

[L MLO synth-2D]
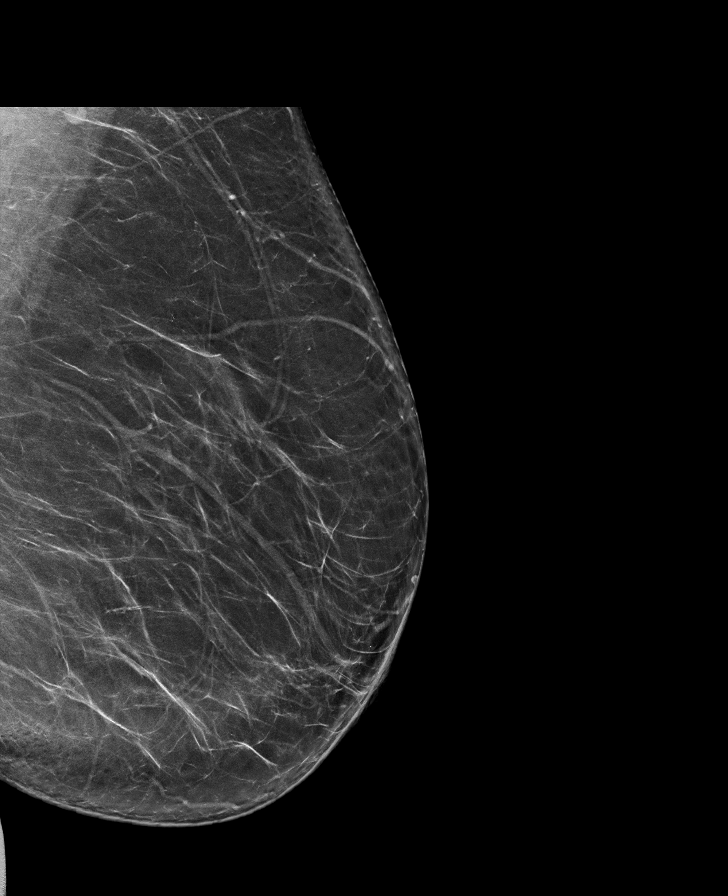

[R MLO tomo · tomo slice 43/84.0]
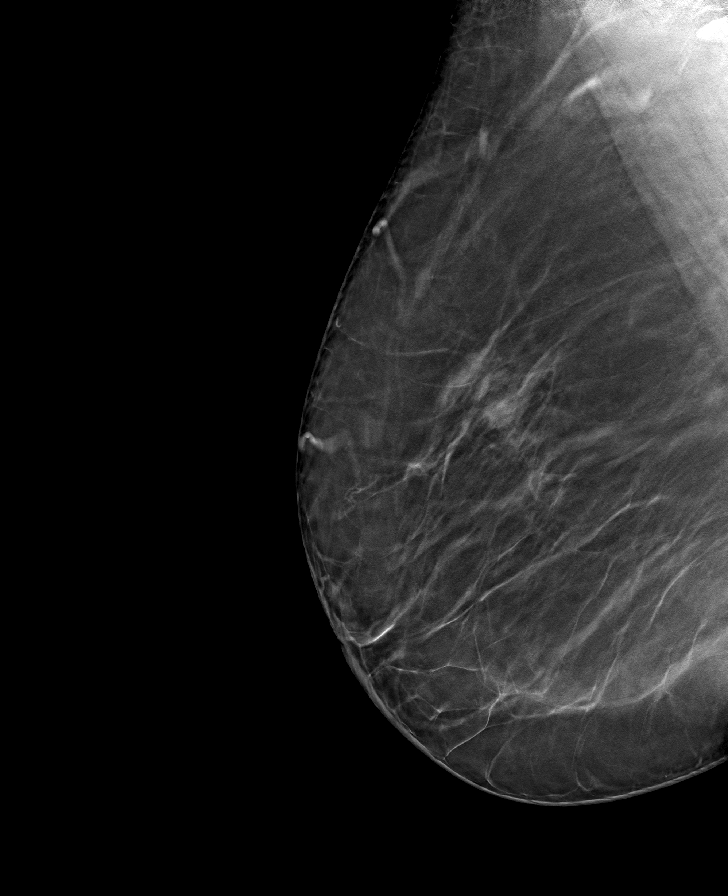

[L MLO tomo · tomo slice 43/84.0]
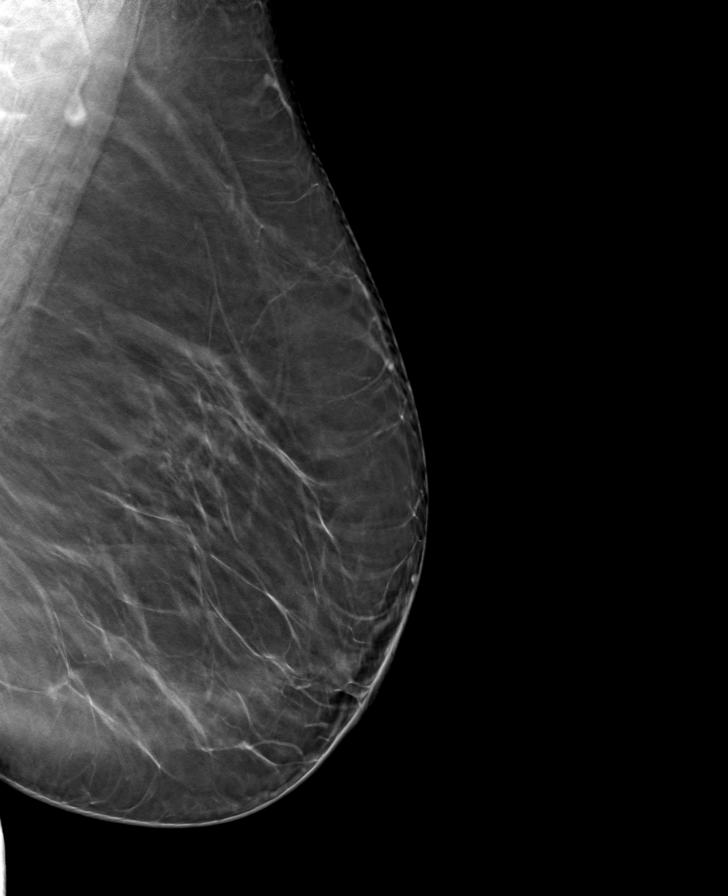

[L CC tomo · tomo slice 41/82.0]
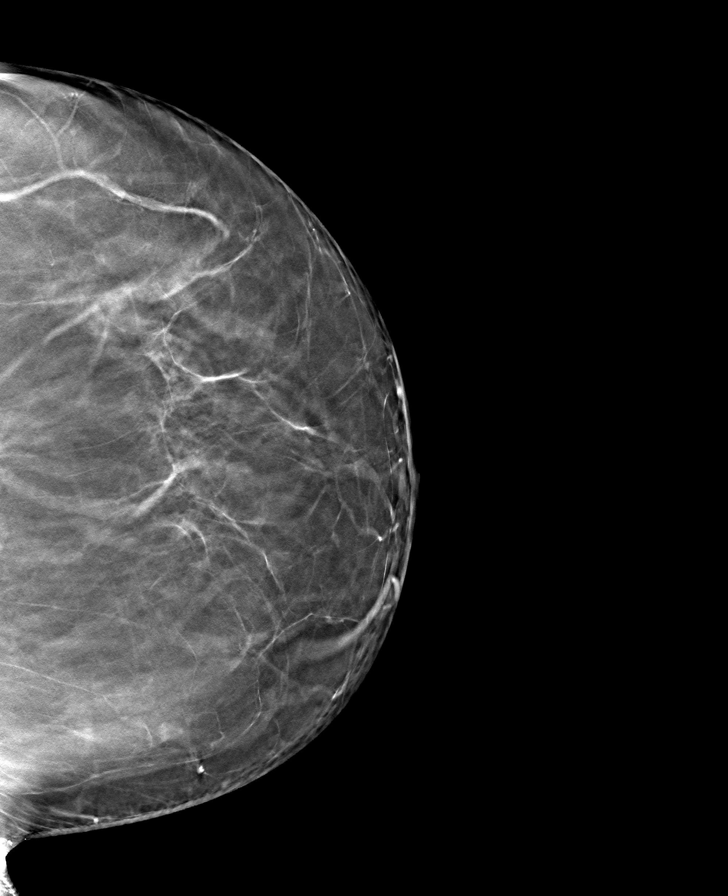

[R CC tomo · tomo slice 41/82.0]
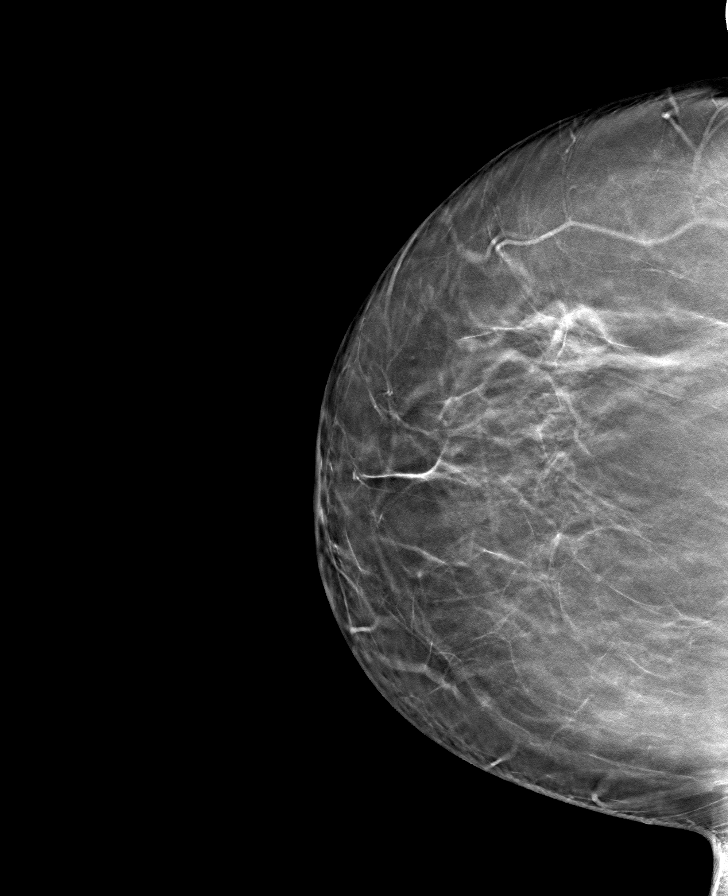

[8 of 24 positions shown; findings below may reference images not displayed]

ACR Breast Density Category b: There are scattered areas of
fibroglandular density.
FINDINGS: There are no findings suspicious for malignancy.
IMPRESSION: No mammographic evidence of malignancy. A result letter of this
screening mammogram will be mailed directly to the patient.

RECOMMENDATION:
Screening mammogram in one year. (Code:51-O-LD2)

BI-RADS CATEGORY  1: Negative.

## 2023-07-03 ENCOUNTER — Other Ambulatory Visit: Payer: Self-pay | Admitting: Family Medicine

## 2023-07-03 DIAGNOSIS — Z1231 Encounter for screening mammogram for malignant neoplasm of breast: Secondary | ICD-10-CM

## 2023-07-16 ENCOUNTER — Ambulatory Visit
Admission: RE | Admit: 2023-07-16 | Discharge: 2023-07-16 | Disposition: A | Discharge status: Home / Self Care | Payer: Federal, State, Local not specified - PPO | Source: Ambulatory Visit | Attending: Family Medicine | Admitting: Family Medicine

## 2023-07-16 DIAGNOSIS — Z1231 Encounter for screening mammogram for malignant neoplasm of breast: Secondary | ICD-10-CM | POA: Insufficient documentation

## 2023-11-06 ENCOUNTER — Ambulatory Visit: Payer: Federal, State, Local not specified - PPO

## 2024-03-10 ENCOUNTER — Ambulatory Visit
Admission: RE | Admit: 2024-03-10 | Discharge: 2024-03-10 | Disposition: A | Discharge status: Home / Self Care | Source: Ambulatory Visit | Attending: Emergency Medicine | Admitting: Emergency Medicine

## 2024-03-10 ENCOUNTER — Other Ambulatory Visit: Payer: Self-pay

## 2024-03-10 VITALS — BP 116/80 | HR 94 | Temp 98.6°F | Resp 18

## 2024-03-10 DIAGNOSIS — R21 Rash and other nonspecific skin eruption: Secondary | ICD-10-CM | POA: Diagnosis not present

## 2024-03-10 MED ORDER — PREDNISONE 10 MG (21) PO TBPK: ORAL_TABLET | Freq: Every day | ORAL | 0 refills | Status: AC

## 2024-03-10 MED ORDER — DEXAMETHASONE SODIUM PHOSPHATE 10 MG/ML IJ SOLN
10.0000 mg | Freq: Once | INTRAMUSCULAR | Status: AC
  Administered 2024-03-10: 10 mg via INTRAMUSCULAR

## 2024-03-10 NOTE — Discharge Instructions (Signed)
 Today you are being treated for inflammatory rash most likely caused by something your skin is coming contact with, at this time no signs of infection  You have been given an injection of steroids today in the office today to help reduce the inflammatory process that occurs with this rash which will help minimize your itching as well as begin to clear  Starting tomorrow take prednisone every morning with food as directed, to continue the above process  You may continue use of topical calamine or Benadryl cream to help manage itching, you may also continue oral Benadryl  Please avoid long exposures to heat such as a hot steamy shower or being outside as this may cause further irritation to your rash  You may follow-up with his urgent care as needed if symptoms persist or worsen

## 2024-03-10 NOTE — ED Provider Notes (Signed)
 Julia Frederick    CSN: [id number] Arrival date & time: 03/10/24  0932      History   Chief Complaint Chief Complaint  Patient presents with   Allergic Reaction    Entered by patient   Appointment    9:45    HPI Julia Frederick is a 58 y.o. female.   Patient presents for evaluation of erythematous rash present to the neck beginning 1 day ago.  Endorses that she typically wears a cool cloth while at work, wet cough from a different water  supply and symptoms started shortly after.  Feels as if it is a sunburn as there is a burning sensation and stinging.  Has attempted Benadryl which has been helpful but rash still present.  Denies drainage, fever or chills.  Denies changes in diet, toiletries or recent travel.  Past Medical History:  Diagnosis Date   GERD (gastroesophageal reflux disease)    PUD (peptic ulcer disease) 2001 UGI   Rectal bleeding 2004 2o to hemorrhoids    Patient Active Problem List   Diagnosis Date Noted   Dyspepsia 09/06/2011   Epigastric pain 09/06/2011    Past Surgical History:  Procedure Laterality Date   ABDOMINAL HYSTERECTOMY     CESAREAN SECTION     CHOLECYSTECTOMY     COLONOSCOPY  2004   ESOPHAGOGASTRODUODENOSCOPY  09/07/2011   Procedure: ESOPHAGOGASTRODUODENOSCOPY (EGD);  Surgeon: Pauleen Borne, MD;  Location: [facility];  Service: Endoscopy;  Laterality: N/A;  1:55   OOPHORECTOMY     S/P Hysterectomy     SHOULDER SURGERY      OB History   No obstetric history on file.      Home Medications    Prior to Admission medications   Medication Sig Start Date End Date Taking? Authorizing Provider  predniSONE (STERAPRED UNI-PAK 21 TAB) 10 MG (21) TBPK tablet Take by mouth daily. Take 6 tabs by mouth daily  for 1 days, then 5 tabs for 1 days, then 4 tabs for 1 days, then 3 tabs for 1 days, 2 tabs for 1 days, then 1 tab by mouth daily for 1 days 03/10/24  Yes Akeia Perot R, NP  fluticasone (FLONASE) 50 MCG/ACT nasal spray Place  into the nose. 02/15/15 02/15/16  [provider]  folic acid (FOLVITE) 1 MG tablet Take 1 mg by mouth daily. 06/09/21   [provider]  meloxicam  (MOBIC ) 15 MG tablet Take 1 tablet (15 mg total) by mouth daily. 12/14/16   Ruffin Cotton, DPM  methotrexate (RHEUMATREX) 2.5 MG tablet Take by mouth. 06/08/21   [provider]  methylPREDNISolone  (MEDROL ) 4 MG TBPK tablet Tapering 6 day dose pack 11/02/15   Hyatt, Max T, DPM  omeprazole  (PRILOSEC) 20 MG capsule Take 1 capsule (20 mg total) by mouth daily. 09/05/11 09/04/12  Alyce Jubilee, MD  ondansetron  (ZOFRAN  ODT) 4 MG disintegrating tablet Take 1 tablet (4 mg total) by mouth every 8 (eight) hours as needed for nausea or vomiting. 10/13/20   Stafford Eagles, PA-C  traMADol (ULTRAM) 50 MG tablet Take 50 mg by mouth every 6 (six) hours as needed. Maximum dose= 8 tablets per day     [provider]    Family History Family History  Problem Relation Age of Onset   Breast cancer Paternal Grandmother    Hypertension Other    Cancer Other     Social History Social History   Tobacco Use   Smoking status: Never   Smokeless  tobacco: Never  Vaping Use   Vaping status: Never Used  Substance Use Topics   Alcohol use: Yes   Drug use: No     Allergies   Patient has no known allergies.   Review of Systems Review of Systems   Physical Exam Triage Vital Signs ED Triage Vitals  Encounter Vitals Group     BP 03/10/24 0955 116/80     Girls Systolic BP Percentile --      Girls Diastolic BP Percentile --      Boys Systolic BP Percentile --      Boys Diastolic BP Percentile --      Pulse Rate 03/10/24 0955 94     Resp 03/10/24 0955 18     Temp 03/10/24 0955 98.6 F (37 C)     Temp Source 03/10/24 0955 Oral     SpO2 03/10/24 0955 98 %     Weight --      Height --      Head Circumference --      Peak Flow --      Pain Score 03/10/24 0952 0     Pain Loc --      Pain Education --      Exclude from  Growth Chart --    No data found.  Updated Vital Signs BP 116/80 (BP Location: Left Arm)   Pulse 94   Temp 98.6 F (37 C) (Oral)   Resp 18   SpO2 98%   Visual Acuity Right Eye Distance:   Left Eye Distance:   Bilateral Distance:    Right Eye Near:   Left Eye Near:    Bilateral Near:     Physical Exam Constitutional:      Appearance: Normal appearance.   Eyes:     Extraocular Movements: Extraocular movements intact.   Pulmonary:     Effort: Pulmonary effort is normal.   Skin:    Comments: Erythematous papular rash present to the bilateral and posterior of the neck, tender, nondraining, skin cool to touch   Neurological:     Mental Status: She is alert and oriented to person, place, and time.      UC Treatments / Results  Labs (all labs ordered are listed, but only abnormal results are displayed) Labs Reviewed - No data to display  EKG   Radiology No results found.  Procedures Procedures (including critical care time)  Medications Ordered in UC Medications  dexamethasone (DECADRON) injection 10 mg (has no administration in time range)    Initial Impression / Assessment and Plan / UC Course  I have reviewed the triage vital signs and the nursing notes.  Pertinent labs & imaging results that were available during my care of the patient were reviewed by me and considered in my medical decision making (see chart for details).  Rash  Appears inflammatory, no signs of infection, most likely contact dermatitis, discussed, Decadron IM given and prescribed oral prednisone Brom use recommended continued use of oral antihistamines for supportive care and advised against long exposure to heat, advise follow-up if symptoms continue to persist Final Clinical Impressions(s) / UC Diagnoses   Final diagnoses:  Rash and nonspecific skin eruption     Discharge Instructions      Today you are being treated for inflammatory rash most likely caused by something  your skin is coming contact with, at this time no signs of infection  You have been given an injection of steroids today in the office today to  help reduce the inflammatory process that occurs with this rash which will help minimize your itching as well as begin to clear  Starting tomorrow take prednisone every morning with food as directed, to continue the above process  You may continue use of topical calamine or Benadryl cream to help manage itching, you may also continue oral Benadryl  Please avoid long exposures to heat such as a hot steamy shower or being outside as this may cause further irritation to your rash  You may follow-up with his urgent care as needed if symptoms persist or worsen    ED Prescriptions     Medication Sig Dispense Auth. Provider   predniSONE (STERAPRED UNI-PAK 21 TAB) 10 MG (21) TBPK tablet Take by mouth daily. Take 6 tabs by mouth daily  for 1 days, then 5 tabs for 1 days, then 4 tabs for 1 days, then 3 tabs for 1 days, 2 tabs for 1 days, then 1 tab by mouth daily for 1 days 21 tablet Ursala Cressy, Maybelle Spatz, NP      PDMP not reviewed this encounter.   Reena Canning, NP 03/10/24 1013

## 2024-03-10 NOTE — ED Triage Notes (Signed)
 Patient uses a cooling towel to keep cool.  Used a different source to wet cloth during the day.  Patient replaced towel to neck and noticed stinging feeling.  Redness to both sides of neck today.  No itching.  Did take benadryl .  Feels like a sunburn.

## 2024-06-26 ENCOUNTER — Other Ambulatory Visit: Payer: Self-pay | Admitting: Family Medicine

## 2024-06-26 DIAGNOSIS — Z1231 Encounter for screening mammogram for malignant neoplasm of breast: Secondary | ICD-10-CM

## 2024-07-22 ENCOUNTER — Encounter

## 2024-10-12 ENCOUNTER — Ambulatory Visit
Admission: RE | Admit: 2024-10-12 | Discharge: 2024-10-12 | Disposition: A | Source: Ambulatory Visit | Attending: Family Medicine | Admitting: Family Medicine

## 2024-10-12 DIAGNOSIS — Z1231 Encounter for screening mammogram for malignant neoplasm of breast: Secondary | ICD-10-CM | POA: Diagnosis present
